# Patient Record
Sex: Male | Born: 1945 | Race: White | Hispanic: No | Marital: Single
Health system: Southern US, Community
[De-identification: ages and names within clinical notes are randomized; demographics above are authoritative.]

---

## 2006-07-30 ENCOUNTER — Inpatient Hospital Stay (HOSPITAL_COMMUNITY): Admission: AD | Admit: 2006-07-30 | Discharge: 2006-07-31 | Payer: Self-pay | Admitting: Gastroenterology

## 2006-08-12 ENCOUNTER — Ambulatory Visit: Payer: Self-pay | Admitting: Internal Medicine

## 2006-08-20 ENCOUNTER — Ambulatory Visit: Payer: Self-pay | Admitting: Internal Medicine

## 2006-09-18 ENCOUNTER — Encounter (INDEPENDENT_AMBULATORY_CARE_PROVIDER_SITE_OTHER): Payer: Self-pay | Admitting: Specialist

## 2006-09-18 ENCOUNTER — Ambulatory Visit: Payer: Self-pay | Admitting: Internal Medicine

## 2009-08-18 ENCOUNTER — Encounter (INDEPENDENT_AMBULATORY_CARE_PROVIDER_SITE_OTHER): Payer: Self-pay | Admitting: *Deleted

## 2009-12-21 ENCOUNTER — Ambulatory Visit: Payer: Self-pay | Admitting: Pulmonary Disease

## 2009-12-21 ENCOUNTER — Inpatient Hospital Stay (HOSPITAL_COMMUNITY): Admission: EM | Admit: 2009-12-21 | Discharge: 2009-12-26 | Payer: Self-pay | Admitting: Emergency Medicine

## 2009-12-22 ENCOUNTER — Ambulatory Visit: Payer: Self-pay | Admitting: Infectious Diseases

## 2010-07-17 NOTE — Letter (Signed)
Summary: Colonoscopy Letter  [facility]  [address] [place], [place] [address]   Phone: [phone]  Fax: [phone]      August 18, 2009 MRN: [id number]   [facility] [address] [place], [place]  [address]   Dear Marc Bauer,   According to your medical record, it is time for you to schedule a Colonoscopy. The American Cancer Society recommends this procedure as a method to detect early colon cancer. Patients with a family history of colon cancer, or a personal history of colon polyps or inflammatory bowel disease are at increased risk.  This letter has beeen generated based on the recommendations made at the time of your procedure. If you feel that in your particular situation this may no longer apply, please contact our office.  Please call our office at [phone] to schedule this appointment or to update your records at your earliest convenience.  Thank you for cooperating with [place] to provide you with the very best care possible.   Sincerely,  Wilhemina Bonito. Marina Goodell, M.D.  [facility] [phone]

## 2010-09-02 LAB — URINALYSIS, ROUTINE W REFLEX MICROSCOPIC
Bilirubin Urine: NEGATIVE
Ketones, ur: NEGATIVE mg/dL
Protein, ur: NEGATIVE mg/dL
pH: 5.5 (ref 5.0–8.0)

## 2010-09-02 LAB — CBC
HCT: 31.1 % — ABNORMAL LOW (ref 39.0–52.0)
HCT: 31.4 % — ABNORMAL LOW (ref 39.0–52.0)
HCT: 32.8 % — ABNORMAL LOW (ref 39.0–52.0)
HCT: 32.8 % — ABNORMAL LOW (ref 39.0–52.0)
HCT: 35.1 % — ABNORMAL LOW (ref 39.0–52.0)
HCT: 38.3 % — ABNORMAL LOW (ref 39.0–52.0)
Hemoglobin: 11.1 g/dL — ABNORMAL LOW (ref 13.0–17.0)
MCH: 30.5 pg (ref 26.0–34.0)
MCH: 30.6 pg (ref 26.0–34.0)
MCH: 31.1 pg (ref 26.0–34.0)
MCH: 31.1 pg (ref 26.0–34.0)
MCH: 31.4 pg (ref 26.0–34.0)
MCHC: 32.8 g/dL (ref 30.0–36.0)
MCHC: 33.5 g/dL (ref 30.0–36.0)
MCV: 92.1 fL (ref 78.0–100.0)
MCV: 92.1 fL (ref 78.0–100.0)
MCV: 92.7 fL (ref 78.0–100.0)
MCV: 92.9 fL (ref 78.0–100.0)
MCV: 93 fL (ref 78.0–100.0)
Platelets: 251 10*3/uL (ref 150–400)
Platelets: 302 10*3/uL (ref 150–400)
RBC: 3.56 MIL/uL — ABNORMAL LOW (ref 4.22–5.81)
RBC: 4.18 MIL/uL — ABNORMAL LOW (ref 4.22–5.81)
RDW: 14.9 % (ref 11.5–15.5)
RDW: 15 % (ref 11.5–15.5)
RDW: 15.3 % (ref 11.5–15.5)
WBC: 17.1 10*3/uL — ABNORMAL HIGH (ref 4.0–10.5)
WBC: 47.4 10*3/uL — ABNORMAL HIGH (ref 4.0–10.5)

## 2010-09-02 LAB — LACTIC ACID, PLASMA
Lactic Acid, Venous: 1.4 mmol/L (ref 0.5–2.2)
Lactic Acid, Venous: 1.8 mmol/L (ref 0.5–2.2)
Lactic Acid, Venous: 3.8 mmol/L — ABNORMAL HIGH (ref 0.5–2.2)

## 2010-09-02 LAB — COMPREHENSIVE METABOLIC PANEL
ALT: 13 U/L (ref 0–53)
Alkaline Phosphatase: 105 U/L (ref 39–117)
Alkaline Phosphatase: 55 U/L (ref 39–117)
BUN: 15 mg/dL (ref 6–23)
BUN: 17 mg/dL (ref 6–23)
CO2: 26 mEq/L (ref 19–32)
Calcium: 8.5 mg/dL (ref 8.4–10.5)
Calcium: 8.6 mg/dL (ref 8.4–10.5)
Chloride: 110 mEq/L (ref 96–112)
Creatinine, Ser: 0.95 mg/dL (ref 0.4–1.5)
Creatinine, Ser: 1.5 mg/dL (ref 0.4–1.5)
GFR calc non Af Amer: >60 mL/min (ref >60)
Glucose, Bld: 101 mg/dL — ABNORMAL HIGH (ref 70–99)
Glucose, Bld: 104 mg/dL — ABNORMAL HIGH (ref 70–99)
Glucose, Bld: 98 mg/dL (ref 70–99)
Potassium: 3.8 mEq/L (ref 3.5–5.1)
Sodium: 137 mEq/L (ref 135–145)
Total Bilirubin: 0.6 mg/dL (ref 0.3–1.2)
Total Protein: 4.9 g/dL — ABNORMAL LOW (ref 6.0–8.3)
Total Protein: 5.5 g/dL — ABNORMAL LOW (ref 6.0–8.3)

## 2010-09-02 LAB — DIFFERENTIAL
Band Neutrophils: 0 % (ref 0–10)
Basophils Absolute: 0 10*3/uL (ref 0.0–0.1)
Basophils Absolute: 0 10*3/uL (ref 0.0–0.1)
Basophils Relative: 0 % (ref 0–1)
Eosinophils Absolute: 0 10*3/uL (ref 0.0–0.7)
Eosinophils Absolute: 0.1 10*3/uL (ref 0.0–0.7)
Eosinophils Relative: 1 % (ref 0–5)
Lymphs Abs: 2.3 10*3/uL (ref 0.7–4.0)
Lymphs Abs: 2.3 10*3/uL (ref 0.7–4.0)
Monocytes Absolute: 1.6 10*3/uL — ABNORMAL HIGH (ref 0.1–1.0)
Monocytes Relative: 5 % (ref 3–12)
Myelocytes: 0 %
Neutrophils Relative %: 92 % — ABNORMAL HIGH (ref 43–77)
Promyelocytes Absolute: 0 %
WBC Morphology: INCREASED

## 2010-09-02 LAB — MAGNESIUM: Magnesium: 2.7 mg/dL — ABNORMAL HIGH (ref 1.5–2.5)

## 2010-09-02 LAB — HEPATIC FUNCTION PANEL
ALT: 14 U/L (ref 0–53)
AST: 25 U/L (ref 0–37)
Bilirubin, Direct: <0.1 mg/dL (ref 0.0–0.3)
Total Bilirubin: 0.3 mg/dL (ref 0.3–1.2)

## 2010-09-02 LAB — URINE CULTURE: Colony Count: 30000

## 2010-09-02 LAB — CULTURE, RESPIRATORY W GRAM STAIN: Culture: NORMAL

## 2010-09-02 LAB — URINALYSIS, MICROSCOPIC ONLY
Bilirubin Urine: NEGATIVE
Hgb urine dipstick: NEGATIVE
Nitrite: NEGATIVE
Protein, ur: NEGATIVE mg/dL
Specific Gravity, Urine: 1.011 (ref 1.005–1.030)
Urobilinogen, UA: 0.2 mg/dL (ref 0.0–1.0)

## 2010-09-02 LAB — ABO/RH: ABO/RH(D): A POS

## 2010-09-02 LAB — CULTURE, BLOOD (ROUTINE X 2)

## 2010-09-02 LAB — URINE MICROSCOPIC-ADD ON

## 2010-09-02 LAB — BASIC METABOLIC PANEL
BUN: 13 mg/dL (ref 6–23)
BUN: 15 mg/dL (ref 6–23)
CO2: 21 mEq/L (ref 19–32)
Chloride: 112 mEq/L (ref 96–112)
Chloride: 113 mEq/L — ABNORMAL HIGH (ref 96–112)
Creatinine, Ser: 1.26 mg/dL (ref 0.4–1.5)
Glucose, Bld: 143 mg/dL — ABNORMAL HIGH (ref 70–99)
Glucose, Bld: 157 mg/dL — ABNORMAL HIGH (ref 70–99)
Potassium: 4.1 mEq/L (ref 3.5–5.1)
Potassium: 4.1 mEq/L (ref 3.5–5.1)

## 2010-09-02 LAB — ROCKY MTN SPOTTED FVR AB, IGG-BLOOD: RMSF IgG: 0.33 IV

## 2010-09-02 LAB — POCT I-STAT, CHEM 8
Chloride: 106 mEq/L (ref 96–112)
Creatinine, Ser: 1.2 mg/dL (ref 0.4–1.5)
HCT: 40 % (ref 39.0–52.0)
Sodium: 138 mEq/L (ref 135–145)
TCO2: 22 mmol/L (ref 0–100)

## 2010-09-02 LAB — GLUCOSE, CAPILLARY
Glucose-Capillary: 109 mg/dL — ABNORMAL HIGH (ref 70–99)
Glucose-Capillary: 140 mg/dL — ABNORMAL HIGH (ref 70–99)
Glucose-Capillary: 256 mg/dL — ABNORMAL HIGH (ref 70–99)

## 2010-09-02 LAB — HIV ANTIBODY (ROUTINE TESTING W REFLEX): HIV: NONREACTIVE

## 2010-09-02 LAB — CARBOXYHEMOGLOBIN: Methemoglobin: 1.3 % (ref 0.0–1.5)

## 2010-09-02 LAB — TSH: TSH: 0.432 u[IU]/mL (ref 0.350–4.500)

## 2010-09-02 LAB — EXPECTORATED SPUTUM ASSESSMENT W GRAM STAIN, RFLX TO RESP C

## 2010-09-02 LAB — CARDIAC PANEL(CRET KIN+CKTOT+MB+TROPI): Relative Index: 3 — ABNORMAL HIGH (ref 0.0–2.5)

## 2010-09-02 LAB — PROCALCITONIN: Procalcitonin: 29.7 ng/mL

## 2010-09-02 LAB — HEPATITIS PANEL, ACUTE: Hep A IgM: NEGATIVE

## 2010-09-02 LAB — LEGIONELLA ANTIGEN, URINE

## 2010-09-02 LAB — D-DIMER, QUANTITATIVE: D-Dimer, Quant: 2.64 ug/mL-FEU — ABNORMAL HIGH (ref 0.00–0.48)

## 2010-09-02 LAB — PHOSPHORUS: Phosphorus: 3.6 mg/dL (ref 2.3–4.6)

## 2010-09-02 LAB — POCT CARDIAC MARKERS

## 2010-09-02 LAB — ROCKY MTN SPOTTED FVR AB, IGM-BLOOD: RMSF IgM: 0.08 IV (ref 0.00–0.89)

## 2010-10-11 ENCOUNTER — Ambulatory Visit: Payer: Medicare Other | Attending: Radiation Oncology | Admitting: Radiation Oncology

## 2010-10-11 DIAGNOSIS — K219 Gastro-esophageal reflux disease without esophagitis: Secondary | ICD-10-CM | POA: Insufficient documentation

## 2010-10-11 DIAGNOSIS — J4489 Other specified chronic obstructive pulmonary disease: Secondary | ICD-10-CM | POA: Insufficient documentation

## 2010-10-11 DIAGNOSIS — Z510 Encounter for antineoplastic radiation therapy: Secondary | ICD-10-CM | POA: Insufficient documentation

## 2010-10-11 DIAGNOSIS — E785 Hyperlipidemia, unspecified: Secondary | ICD-10-CM | POA: Insufficient documentation

## 2010-10-11 DIAGNOSIS — F17200 Nicotine dependence, unspecified, uncomplicated: Secondary | ICD-10-CM | POA: Insufficient documentation

## 2010-10-11 DIAGNOSIS — C3410 Malignant neoplasm of upper lobe, unspecified bronchus or lung: Secondary | ICD-10-CM | POA: Insufficient documentation

## 2010-10-11 DIAGNOSIS — Z9089 Acquired absence of other organs: Secondary | ICD-10-CM | POA: Insufficient documentation

## 2010-10-11 DIAGNOSIS — Z8711 Personal history of peptic ulcer disease: Secondary | ICD-10-CM | POA: Insufficient documentation

## 2010-10-11 DIAGNOSIS — J449 Chronic obstructive pulmonary disease, unspecified: Secondary | ICD-10-CM | POA: Insufficient documentation
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
## 2010-12-30 ENCOUNTER — Emergency Department (HOSPITAL_COMMUNITY): Payer: Medicare Other

## 2010-12-30 ENCOUNTER — Inpatient Hospital Stay (HOSPITAL_COMMUNITY)
Admission: EM | Admit: 2010-12-30 | Discharge: 2011-01-16 | DRG: 870 | Disposition: E | Payer: Medicare Other | Attending: Internal Medicine | Admitting: Internal Medicine

## 2010-12-30 DIAGNOSIS — R627 Adult failure to thrive: Secondary | ICD-10-CM | POA: Diagnosis present

## 2010-12-30 DIAGNOSIS — C3490 Malignant neoplasm of unspecified part of unspecified bronchus or lung: Secondary | ICD-10-CM | POA: Diagnosis present

## 2010-12-30 DIAGNOSIS — C779 Secondary and unspecified malignant neoplasm of lymph node, unspecified: Secondary | ICD-10-CM | POA: Diagnosis present

## 2010-12-30 DIAGNOSIS — E872 Acidosis, unspecified: Secondary | ICD-10-CM | POA: Diagnosis present

## 2010-12-30 DIAGNOSIS — Z66 Do not resuscitate: Secondary | ICD-10-CM | POA: Diagnosis present

## 2010-12-30 DIAGNOSIS — Z515 Encounter for palliative care: Secondary | ICD-10-CM

## 2010-12-30 DIAGNOSIS — D65 Disseminated intravascular coagulation [defibrination syndrome]: Secondary | ICD-10-CM | POA: Diagnosis present

## 2010-12-30 DIAGNOSIS — J4489 Other specified chronic obstructive pulmonary disease: Secondary | ICD-10-CM | POA: Diagnosis present

## 2010-12-30 DIAGNOSIS — J449 Chronic obstructive pulmonary disease, unspecified: Secondary | ICD-10-CM | POA: Diagnosis present

## 2010-12-30 DIAGNOSIS — N179 Acute kidney failure, unspecified: Secondary | ICD-10-CM | POA: Diagnosis present

## 2010-12-30 DIAGNOSIS — A419 Sepsis, unspecified organism: Secondary | ICD-10-CM | POA: Diagnosis present

## 2010-12-30 DIAGNOSIS — R34 Anuria and oliguria: Secondary | ICD-10-CM | POA: Diagnosis present

## 2010-12-30 DIAGNOSIS — Z9089 Acquired absence of other organs: Secondary | ICD-10-CM

## 2010-12-30 DIAGNOSIS — J9600 Acute respiratory failure, unspecified whether with hypoxia or hypercapnia: Secondary | ICD-10-CM | POA: Diagnosis present

## 2010-12-31 ENCOUNTER — Inpatient Hospital Stay (HOSPITAL_COMMUNITY): Payer: Medicare Other

## 2010-12-31 DIAGNOSIS — A419 Sepsis, unspecified organism: Secondary | ICD-10-CM

## 2010-12-31 DIAGNOSIS — R6520 Severe sepsis without septic shock: Secondary | ICD-10-CM

## 2010-12-31 DIAGNOSIS — J189 Pneumonia, unspecified organism: Secondary | ICD-10-CM

## 2010-12-31 DIAGNOSIS — R6521 Severe sepsis with septic shock: Secondary | ICD-10-CM

## 2010-12-31 LAB — BLOOD GAS, ARTERIAL
Acid-base deficit: 14.2 mmol/L — ABNORMAL HIGH (ref 0.0–2.0)
Acid-base deficit: 20.2 mmol/L — ABNORMAL HIGH (ref 0.0–2.0)
Acid-base deficit: 22.3 mmol/L — ABNORMAL HIGH (ref 0.0–2.0)
Bicarbonate: 11.4 mEq/L — ABNORMAL LOW (ref 20.0–24.0)
Bicarbonate: 7.3 meq/L — ABNORMAL LOW (ref 20.0–24.0)
Bicarbonate: 8.7 mEq/L — ABNORMAL LOW (ref 20.0–24.0)
Drawn by: 30860
Drawn by: 308601
FIO2: 0.4 %
FIO2: 0.5 %
FIO2: 1 %
MECHVT: 500 mL
MECHVT: 550 mL
MECHVT: 700 mL
O2 Saturation: 97.5 %
O2 Saturation: 98.9 %
O2 Saturation: 99.2 %
PEEP: 5 cmH2O
PEEP: 5 cmH2O
PEEP: 5 cmH2O
Patient temperature: 98
Patient temperature: 98.6
Patient temperature: 99.4
Patient temperature: 99.4
RATE: 14 {breaths}/min
RATE: 20 resp/min
RATE: 8 resp/min
TCO2: 10.6 mmol/L (ref 0–100)
TCO2: 7.5 mmol/L (ref 0–100)
pCO2 arterial: 29.8 mmHg — ABNORMAL LOW (ref 35.0–45.0)
pH, Arterial: 7.023 — CL (ref 7.350–7.450)
pH, Arterial: 7.07 — CL (ref 7.350–7.450)
pH, Arterial: 7.253 — ABNORMAL LOW (ref 7.350–7.450)
pO2, Arterial: 121 mmHg — ABNORMAL HIGH (ref 80.0–100.0)
pO2, Arterial: 441 mmHg — ABNORMAL HIGH (ref 80.0–100.0)

## 2010-12-31 LAB — CBC
HCT: 40.6 % (ref 39.0–52.0)
Hemoglobin: 13.1 g/dL (ref 13.0–17.0)
Hemoglobin: 13.1 g/dL (ref 13.0–17.0)
MCH: 29.5 pg (ref 26.0–34.0)
MCH: 30.3 pg (ref 26.0–34.0)
MCHC: 32.3 g/dL (ref 30.0–36.0)
MCV: 91.4 fL (ref 78.0–100.0)
Platelets: 54 10*3/uL — ABNORMAL LOW (ref 150–400)
Platelets: DECREASED 10*3/uL (ref 150–400)
RBC: 4.33 MIL/uL (ref 4.22–5.81)
RBC: 4.44 MIL/uL (ref 4.22–5.81)
WBC: 17.1 10*3/uL — ABNORMAL HIGH (ref 4.0–10.5)

## 2010-12-31 LAB — COMPREHENSIVE METABOLIC PANEL WITH GFR
ALT: 28 U/L (ref 0–53)
AST: 63 U/L — ABNORMAL HIGH (ref 0–37)
Albumin: 2.2 g/dL — ABNORMAL LOW (ref 3.5–5.2)
Alkaline Phosphatase: 127 U/L — ABNORMAL HIGH (ref 39–117)
BUN: 37 mg/dL — ABNORMAL HIGH (ref 6–23)
CO2: 9 meq/L — CL (ref 19–32)
Calcium: 9.5 mg/dL (ref 8.4–10.5)
Chloride: 100 meq/L (ref 96–112)
Creatinine, Ser: 3.79 mg/dL — ABNORMAL HIGH (ref 0.50–1.35)
GFR calc Af Amer: 19 mL/min — ABNORMAL LOW
GFR calc non Af Amer: 16 mL/min — ABNORMAL LOW
Glucose, Bld: 216 mg/dL — ABNORMAL HIGH (ref 70–99)
Potassium: 4 meq/L (ref 3.5–5.1)
Sodium: 137 meq/L (ref 135–145)
Total Bilirubin: 0.4 mg/dL (ref 0.3–1.2)
Total Protein: 5.7 g/dL — ABNORMAL LOW (ref 6.0–8.3)

## 2010-12-31 LAB — GLUCOSE, CAPILLARY
Glucose-Capillary: 158 mg/dL — ABNORMAL HIGH (ref 70–99)
Glucose-Capillary: 28 mg/dL — CL (ref 70–99)
Glucose-Capillary: 302 mg/dL — ABNORMAL HIGH (ref 70–99)

## 2010-12-31 LAB — URINALYSIS, ROUTINE W REFLEX MICROSCOPIC
Nitrite: NEGATIVE
Specific Gravity, Urine: 1.026 (ref 1.005–1.030)
Urobilinogen, UA: 1 mg/dL (ref 0.0–1.0)
pH: 5 (ref 5.0–8.0)

## 2010-12-31 LAB — TYPE AND SCREEN: Antibody Screen: NEGATIVE

## 2010-12-31 LAB — DIFFERENTIAL
Basophils Relative: 0 % (ref 0–1)
Eosinophils Relative: 0 % (ref 0–5)
Monocytes Absolute: 0.7 10*3/uL (ref 0.1–1.0)
Monocytes Relative: 4 % (ref 3–12)
Neutrophils Relative %: 91 % — ABNORMAL HIGH (ref 43–77)

## 2010-12-31 LAB — LACTIC ACID, PLASMA: Lactic Acid, Venous: 12.8 mmol/L — ABNORMAL HIGH (ref 0.5–2.2)

## 2010-12-31 LAB — BASIC METABOLIC PANEL
BUN: 39 mg/dL — ABNORMAL HIGH (ref 6–23)
Chloride: 105 mEq/L (ref 96–112)
Creatinine, Ser: 3.73 mg/dL — ABNORMAL HIGH (ref 0.50–1.35)
Glucose, Bld: 323 mg/dL — ABNORMAL HIGH (ref 70–99)
Potassium: 4.5 mEq/L (ref 3.5–5.1)

## 2010-12-31 LAB — URINE MICROSCOPIC-ADD ON

## 2010-12-31 LAB — PATHOLOGIST SMEAR REVIEW

## 2010-12-31 LAB — PROTIME-INR
INR: 3.16 — ABNORMAL HIGH (ref 0.00–1.49)
Prothrombin Time: 29.1 seconds — ABNORMAL HIGH (ref 11.6–15.2)
Prothrombin Time: 33 seconds — ABNORMAL HIGH (ref 11.6–15.2)

## 2010-12-31 LAB — CORTISOL: Cortisol, Plasma: 18.6 ug/dL

## 2010-12-31 LAB — PROCALCITONIN: Procalcitonin: 103.86 ng/mL

## 2010-12-31 LAB — APTT: aPTT: 155 seconds — ABNORMAL HIGH (ref 24–37)

## 2010-12-31 LAB — ABO/RH: ABO/RH(D): A POS

## 2011-01-01 ENCOUNTER — Inpatient Hospital Stay (HOSPITAL_COMMUNITY): Payer: Medicare Other

## 2011-01-01 DIAGNOSIS — J9600 Acute respiratory failure, unspecified whether with hypoxia or hypercapnia: Secondary | ICD-10-CM

## 2011-01-01 DIAGNOSIS — I509 Heart failure, unspecified: Secondary | ICD-10-CM

## 2011-01-01 DIAGNOSIS — N179 Acute kidney failure, unspecified: Secondary | ICD-10-CM

## 2011-01-01 LAB — PREPARE FRESH FROZEN PLASMA: Unit division: 0

## 2011-01-01 LAB — COMPREHENSIVE METABOLIC PANEL
Albumin: 1.6 g/dL — ABNORMAL LOW (ref 3.5–5.2)
BUN: 55 mg/dL — ABNORMAL HIGH (ref 6–23)
Calcium: 6.7 mg/dL — ABNORMAL LOW (ref 8.4–10.5)
Creatinine, Ser: 5.17 mg/dL — ABNORMAL HIGH (ref 0.50–1.35)
Total Protein: 4.5 g/dL — ABNORMAL LOW (ref 6.0–8.3)

## 2011-01-01 LAB — GLUCOSE, CAPILLARY
Glucose-Capillary: 148 mg/dL — ABNORMAL HIGH (ref 70–99)
Glucose-Capillary: 116 mg/dL — ABNORMAL HIGH (ref 70–99)
Glucose-Capillary: 108 mg/dL — ABNORMAL HIGH (ref 70–99)

## 2011-01-01 LAB — CBC
MCH: 30.4 pg (ref 26.0–34.0)
MCHC: 35.5 g/dL (ref 30.0–36.0)
MCV: 85.6 fL (ref 78.0–100.0)
Platelets: 9 10*3/uL — CL (ref 150–400)

## 2011-01-01 LAB — URINE CULTURE
Colony Count: NO GROWTH
Culture: NO GROWTH

## 2011-01-01 LAB — BLOOD GAS, ARTERIAL
Acid-base deficit: 2.1 mmol/L — ABNORMAL HIGH (ref 0.0–2.0)
Bicarbonate: 22.3 mEq/L (ref 20.0–24.0)
FIO2: 0.5 %
MECHVT: 550 mL
PEEP: 5 cmH2O
Patient temperature: 98.6
TCO2: 20.8 mmol/L (ref 0–100)
pCO2 arterial: 39 mmHg (ref 35.0–45.0)
pH, Arterial: 7.323 — ABNORMAL LOW (ref 7.350–7.450)
pH, Arterial: 7.375 (ref 7.350–7.450)
pO2, Arterial: 67.1 mmHg — ABNORMAL LOW (ref 80.0–100.0)

## 2011-01-01 LAB — DIC (DISSEMINATED INTRAVASCULAR COAGULATION)PANEL
D-Dimer, Quant: >20 ug/mL-FEU — ABNORMAL HIGH (ref 0.00–0.48)
Fibrinogen: 446 mg/dL (ref 204–475)
Prothrombin Time: 24.1 seconds — ABNORMAL HIGH (ref 11.6–15.2)

## 2011-01-01 LAB — PLATELET COUNT: Platelets: 35 10*3/uL — ABNORMAL LOW (ref 150–400)

## 2011-01-01 LAB — LACTIC ACID, PLASMA: Lactic Acid, Venous: 5.8 mmol/L — ABNORMAL HIGH (ref 0.5–2.2)

## 2011-01-02 ENCOUNTER — Inpatient Hospital Stay (HOSPITAL_COMMUNITY): Payer: Medicare Other

## 2011-01-02 LAB — BLOOD GAS, ARTERIAL
Bicarbonate: 20.4 mEq/L (ref 20.0–24.0)
MECHVT: 0.55 mL
O2 Saturation: 99 %
PEEP: 5 cmH2O
Patient temperature: 101.3
RATE: 14 resp/min

## 2011-01-02 LAB — CULTURE, RESPIRATORY W GRAM STAIN

## 2011-01-02 LAB — APTT: aPTT: 37 seconds (ref 24–37)

## 2011-01-02 LAB — CBC
HCT: 24.5 % — ABNORMAL LOW (ref 39.0–52.0)
Platelets: 23 10*3/uL — CL (ref 150–400)
RBC: 2.92 MIL/uL — ABNORMAL LOW (ref 4.22–5.81)
RDW: 16.5 % — ABNORMAL HIGH (ref 11.5–15.5)
WBC: 32.5 10*3/uL — ABNORMAL HIGH (ref 4.0–10.5)

## 2011-01-02 LAB — PREPARE PLATELET PHERESIS

## 2011-01-02 LAB — GLUCOSE, CAPILLARY
Glucose-Capillary: 113 mg/dL — ABNORMAL HIGH (ref 70–99)
Glucose-Capillary: 105 mg/dL — ABNORMAL HIGH (ref 70–99)

## 2011-01-02 LAB — CULTURE, BLOOD (ROUTINE X 2): Culture  Setup Time: 201207160946

## 2011-01-02 LAB — BASIC METABOLIC PANEL
Chloride: 93 mEq/L — ABNORMAL LOW (ref 96–112)
GFR calc Af Amer: 11 mL/min — ABNORMAL LOW (ref >60)
Potassium: 5.1 mEq/L (ref 3.5–5.1)

## 2011-01-02 LAB — PROTIME-INR: Prothrombin Time: 17.7 seconds — ABNORMAL HIGH (ref 11.6–15.2)

## 2011-01-03 ENCOUNTER — Inpatient Hospital Stay (HOSPITAL_COMMUNITY): Payer: Medicare Other

## 2011-01-03 LAB — BASIC METABOLIC PANEL
CO2: 22 mEq/L (ref 19–32)
Calcium: 6.9 mg/dL — ABNORMAL LOW (ref 8.4–10.5)
Creatinine, Ser: 7.35 mg/dL — ABNORMAL HIGH (ref 0.50–1.35)
GFR calc Af Amer: 9 mL/min — ABNORMAL LOW (ref >60)
GFR calc non Af Amer: 7 mL/min — ABNORMAL LOW (ref >60)
Sodium: 132 mEq/L — ABNORMAL LOW (ref 135–145)

## 2011-01-03 LAB — CBC
MCV: 83 fL (ref 78.0–100.0)
Platelets: 17 10*3/uL — CL (ref 150–400)
RBC: 2.76 MIL/uL — ABNORMAL LOW (ref 4.22–5.81)
RDW: 16.4 % — ABNORMAL HIGH (ref 11.5–15.5)
WBC: 36.5 10*3/uL — ABNORMAL HIGH (ref 4.0–10.5)

## 2011-01-03 LAB — APTT: aPTT: 32 seconds (ref 24–37)

## 2011-01-03 LAB — PROTIME-INR: INR: 1.25 (ref 0.00–1.49)

## 2011-01-04 LAB — GLUCOSE, CAPILLARY: Glucose-Capillary: >600 mg/dL (ref 70–99)

## 2011-01-06 LAB — CULTURE, BLOOD (ROUTINE X 2)
Culture  Setup Time: 201207160946
Culture: NO GROWTH

## 2011-01-16 DEATH — deceased
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
## 2013-04-01 IMAGING — CR DG CHEST 1V PORT
1 series · 1 of 1 positions shown · non-contrast
Comparison: 12/31/2010 and earlier.

CLINICAL DATA: 55-year-old male respiratory distress.

PORTABLE CHEST - 1 VIEW

[AP]
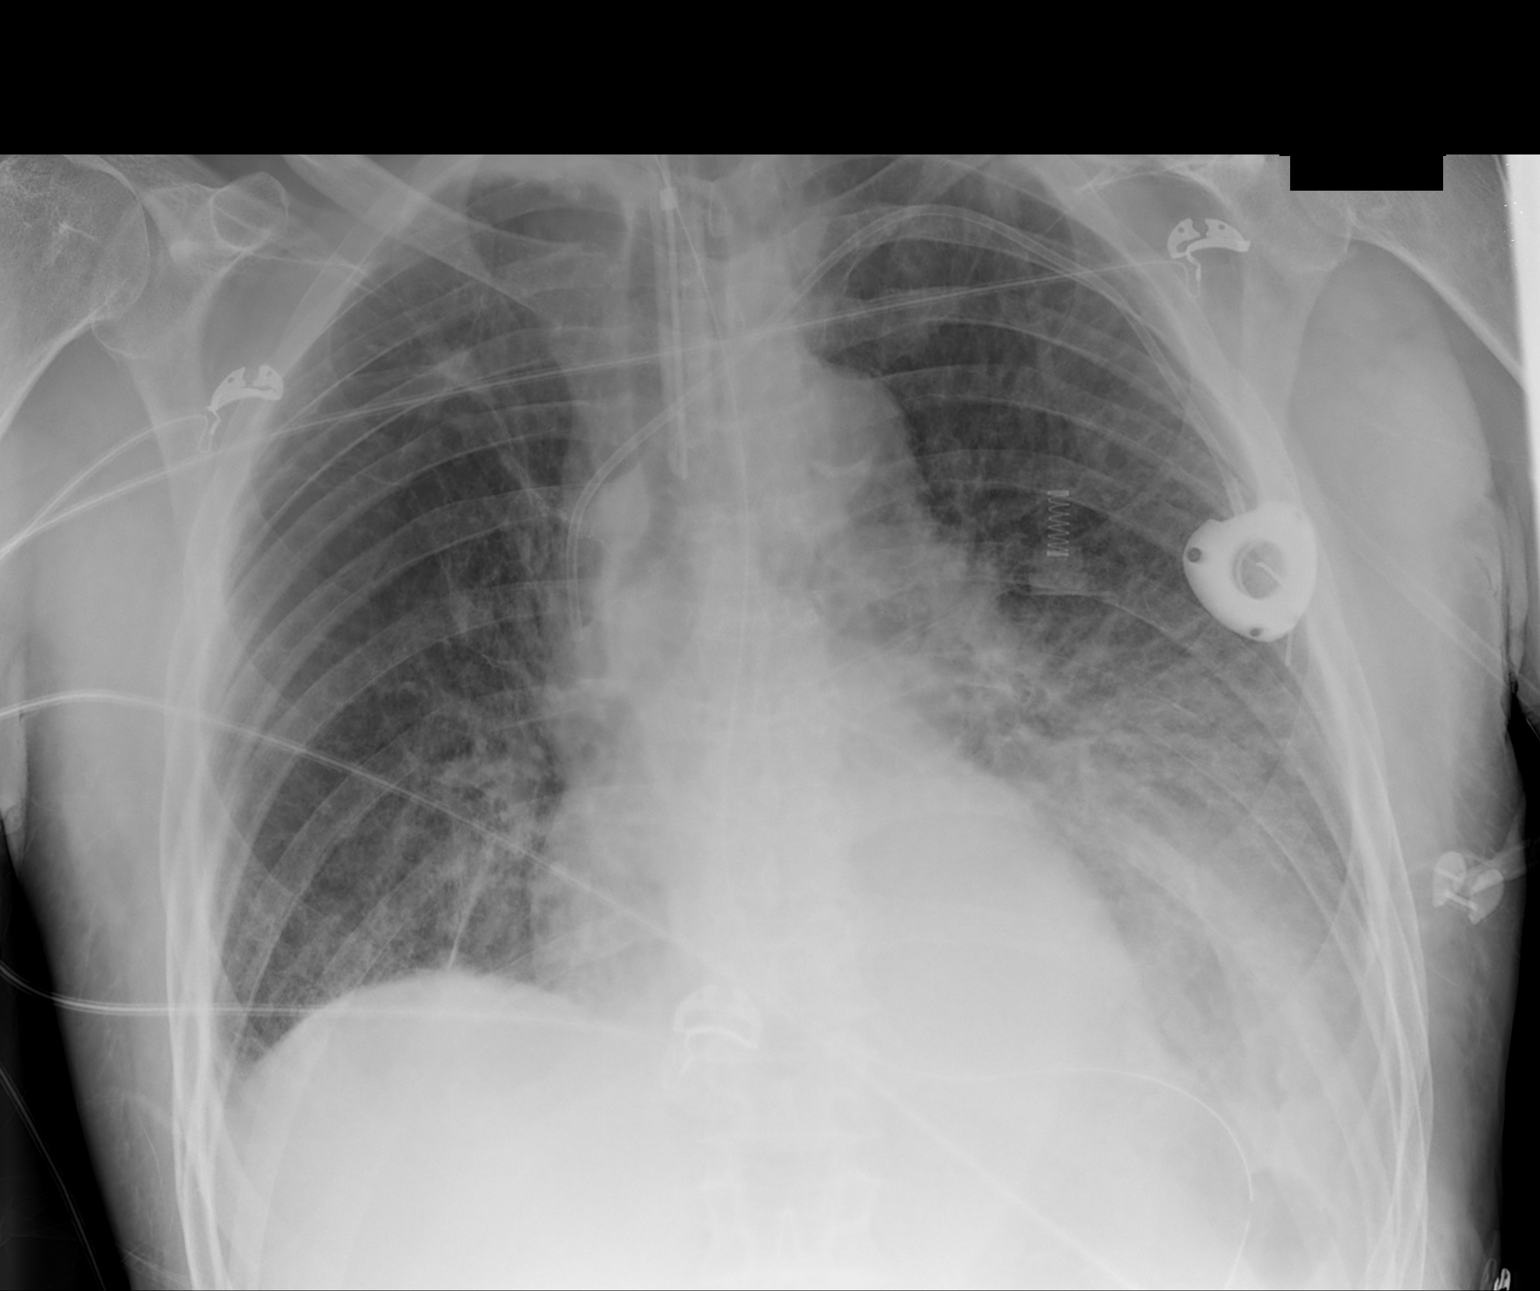

[1 of 1 positions shown; findings below may reference images not displayed]

FINDINGS: Semi upright AP portable view 1575 hours.  Stable
endotracheal tube tip just above the carina.  Stable visualized
enteric tube.  Stable left subclavian central line.

Worsening ventilation at the left base.  Dense retrocardiac
opacity.  No pneumothorax or large effusion.  Right apical
consolidation.  Right suprahilar nodular mass again noted.
IMPRESSION: 1. Stable lines and tubes.
2.  Worsening ventilation at the left base with pleural effusion
and atelectasis / consolidation.
3.  Right apical consolidation and suprahilar nodule/mass are
stable.
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
## 2015-08-24 ENCOUNTER — Telehealth: Payer: Self-pay | Admitting: *Deleted

## 2015-08-24 NOTE — Telephone Encounter (Signed)
Error incorrect pt
